# Patient Record
Sex: Female | Born: 1935 | Race: White | Hispanic: No
Health system: Southern US, Community
[De-identification: ages and names within clinical notes are randomized; demographics above are authoritative.]

## PROBLEM LIST (undated history)

## (undated) DIAGNOSIS — I10 Essential (primary) hypertension: Secondary | ICD-10-CM

---

## 2014-08-08 ENCOUNTER — Emergency Department (HOSPITAL_BASED_OUTPATIENT_CLINIC_OR_DEPARTMENT_OTHER)
Admission: EM | Admit: 2014-08-08 | Discharge: 2014-08-09 | Disposition: A | Discharge status: Home / Self Care | Payer: Medicare HMO | Attending: Emergency Medicine | Admitting: Emergency Medicine

## 2014-08-08 ENCOUNTER — Encounter (HOSPITAL_BASED_OUTPATIENT_CLINIC_OR_DEPARTMENT_OTHER): Payer: Self-pay | Admitting: *Deleted

## 2014-08-08 ENCOUNTER — Emergency Department (HOSPITAL_BASED_OUTPATIENT_CLINIC_OR_DEPARTMENT_OTHER): Payer: Medicare HMO

## 2014-08-08 DIAGNOSIS — R0602 Shortness of breath: Secondary | ICD-10-CM | POA: Diagnosis not present

## 2014-08-08 DIAGNOSIS — R1084 Generalized abdominal pain: Secondary | ICD-10-CM | POA: Diagnosis not present

## 2014-08-08 DIAGNOSIS — I10 Essential (primary) hypertension: Secondary | ICD-10-CM | POA: Insufficient documentation

## 2014-08-08 DIAGNOSIS — R112 Nausea with vomiting, unspecified: Secondary | ICD-10-CM | POA: Diagnosis present

## 2014-08-08 DIAGNOSIS — E876 Hypokalemia: Secondary | ICD-10-CM | POA: Diagnosis not present

## 2014-08-08 DIAGNOSIS — R1110 Vomiting, unspecified: Secondary | ICD-10-CM

## 2014-08-08 DIAGNOSIS — R531 Weakness: Secondary | ICD-10-CM | POA: Insufficient documentation

## 2014-08-08 HISTORY — DX: Essential (primary) hypertension: I10

## 2014-08-08 LAB — COMPREHENSIVE METABOLIC PANEL
ALK PHOS: 98 U/L (ref 39–117)
ALT: 13 U/L (ref 0–35)
AST: 22 U/L (ref 0–37)
Albumin: 4.7 g/dL (ref 3.5–5.2)
Anion gap: 11 (ref 5–15)
BUN: 11 mg/dL (ref 6–23)
CO2: 31 mmol/L (ref 19–32)
Calcium: 9.7 mg/dL (ref 8.4–10.5)
Chloride: 88 mmol/L — ABNORMAL LOW (ref 96–112)
Creatinine, Ser: 0.58 mg/dL (ref 0.50–1.10)
GFR calc Af Amer: >90 mL/min (ref >90)
GFR calc non Af Amer: 87 mL/min — ABNORMAL LOW (ref >90)
Glucose, Bld: 140 mg/dL — ABNORMAL HIGH (ref 70–99)
POTASSIUM: 2.9 mmol/L — AB (ref 3.5–5.1)
SODIUM: 130 mmol/L — AB (ref 135–145)
Total Bilirubin: 0.6 mg/dL (ref 0.3–1.2)
Total Protein: 7.6 g/dL (ref 6.0–8.3)

## 2014-08-08 LAB — LIPASE, BLOOD: LIPASE: 28 U/L (ref 11–59)

## 2014-08-08 LAB — CBC WITH DIFFERENTIAL/PLATELET
Basophils Absolute: 0 10*3/uL (ref 0.0–0.1)
Basophils Relative: 1 % (ref 0–1)
Eosinophils Absolute: 0 10*3/uL (ref 0.0–0.7)
Eosinophils Relative: 0 % (ref 0–5)
HCT: 45.1 % (ref 36.0–46.0)
Hemoglobin: 15.9 g/dL — ABNORMAL HIGH (ref 12.0–15.0)
Lymphocytes Relative: 11 % — ABNORMAL LOW (ref 12–46)
Lymphs Abs: 0.9 10*3/uL (ref 0.7–4.0)
MCH: 30.9 pg (ref 26.0–34.0)
MCHC: 35.3 g/dL (ref 30.0–36.0)
MCV: 87.7 fL (ref 78.0–100.0)
MONO ABS: 0.5 10*3/uL (ref 0.1–1.0)
Monocytes Relative: 6 % (ref 3–12)
Neutro Abs: 7 10*3/uL (ref 1.7–7.7)
Neutrophils Relative %: 82 % — ABNORMAL HIGH (ref 43–77)
PLATELETS: 278 10*3/uL (ref 150–400)
RBC: 5.14 MIL/uL — ABNORMAL HIGH (ref 3.87–5.11)
RDW: 12.6 % (ref 11.5–15.5)
WBC: 8.5 10*3/uL (ref 4.0–10.5)

## 2014-08-08 MED ORDER — IOHEXOL 300 MG/ML  SOLN: 80.0000 mL | Freq: Once | INTRAMUSCULAR | Status: AC | PRN

## 2014-08-08 MED ORDER — ONDANSETRON HCL 4 MG/2ML IJ SOLN
4.0000 mg | Freq: Once | INTRAMUSCULAR | Status: AC
  Administered 2014-08-08: 4 mg via INTRAVENOUS
  Filled 2014-08-08: qty 2

## 2014-08-08 MED ORDER — POTASSIUM CHLORIDE 10 MEQ/100ML IV SOLN
10.0000 meq | INTRAVENOUS | Status: AC
  Administered 2014-08-09: 10 meq via INTRAVENOUS
  Filled 2014-08-08: qty 100

## 2014-08-08 MED ORDER — SODIUM CHLORIDE 0.9 % IV BOLUS (SEPSIS)
1000.0000 mL | Freq: Once | INTRAVENOUS | Status: AC
  Administered 2014-08-09: 1000 mL via INTRAVENOUS

## 2014-08-08 MED ORDER — SODIUM CHLORIDE 0.9 % IV BOLUS (SEPSIS)
1000.0000 mL | Freq: Once | INTRAVENOUS | Status: AC
  Administered 2014-08-08: 1000 mL via INTRAVENOUS

## 2014-08-08 NOTE — ED Provider Notes (Addendum)
CSN: [id number]     Arrival date & time 08/08/14  2128 History  This chart was scribed for Gwyneth Sprout, MD by Gwenyth Ober, ED Scribe. This patient was seen in room MH03/MH03 and the patient's care was started at 10:16 PM.    Chief Complaint  Patient presents with  . Emesis   The history is provided by the patient. No language interpreter was used.    HPI Comments: Lorraine Griffin is a 79 y.o. female with a history of hysterectomy who presents to the Emergency Department complaining of intermittent, gradually improving vomiting that occurs hourly and started yesterday. She states RLQ abdominal pain that started this morning, 1 episode of hematemesis and nausea as associated symptoms. Pt notes increased weakness and shakiness that started 2-3 months ago. She also notes baseline SOB that is unchanged today. Pt drinks 1 beer weekly. She denies a history of abdominal surgeries. Pt also denies diarrhea, fever and cough as associated symptoms.   Past Medical History  Diagnosis Date  . Hypertension    No past surgical history on file. No family history on file. History  Substance Use Topics  . Smoking status: Not on file  . Smokeless tobacco: Not on file  . Alcohol Use: Not on file   OB History    No data available     Review of Systems  Constitutional: Negative for fever.  Respiratory: Positive for shortness of breath. Negative for cough.   Gastrointestinal: Positive for nausea, vomiting and abdominal pain. Negative for diarrhea.  Neurological: Positive for weakness.  All other systems reviewed and are negative.   Allergies  Review of patient's allergies indicates no known allergies.  Home Medications   Prior to Admission medications   Medication Sig Start Date End Date Taking? Authorizing Provider  AmLODIPine Besylate (NORVASC PO) Take by mouth.   Yes Historical Provider, MD  Telmisartan (MICARDIS PO) Take by mouth.   Yes Historical Provider, MD   BP 168/89 mmHg  Pulse  87  Temp(Src) 98.3 F (36.8 C) (Oral)  Resp 20  Ht  (1.651 m)  Wt 123 lb (55.792 kg)  BMI 20.47 kg/m2  SpO2 97% Physical Exam  Constitutional: She appears well-developed and well-nourished. No distress.  HENT:  Head: Normocephalic and atraumatic.  Dry MM  Eyes: Conjunctivae and EOM are normal.  Neck: Neck supple. No tracheal deviation present.  Cardiovascular: Normal rate, regular rhythm and normal heart sounds.   Pulmonary/Chest: Effort normal and breath sounds normal. No respiratory distress.  Slight tachypnea; lungs are clear  Abdominal:  No localized abdominal tenderness; normal bowel sounds  Musculoskeletal: She exhibits no edema.  Skin: Skin is warm and dry. No rash noted.  Psychiatric: She has a normal mood and affect. Her behavior is normal.  Nursing note and vitals reviewed.   ED Course  Procedures   DIAGNOSTIC STUDIES: Oxygen Saturation is 97% on RA, normal by my interpretation.    COORDINATION OF CARE: 10:22 PM Discussed treatment plan with pt and her daughter which includes lab work. They agreed to plan.   Labs Review Labs Reviewed  CBC WITH DIFFERENTIAL/PLATELET - Abnormal; Notable for the following:    RBC 5.14 (*)    Hemoglobin 15.9 (*)    Neutrophils Relative % 82 (*)    Lymphocytes Relative 11 (*)    All other components within normal limits  COMPREHENSIVE METABOLIC PANEL - Abnormal; Notable for the following:    Sodium 130 (*)    Potassium 2.9 (*)  Chloride 88 (*)    Glucose, Bld 140 (*)    GFR calc non Af Amer 87 (*)    All other components within normal limits  LIPASE, BLOOD  URINALYSIS, ROUTINE W REFLEX MICROSCOPIC    Imaging Review Ct Chest W Contrast  08/09/2014   CLINICAL DATA:  Acute onset of generalized abdominal pain and vomiting. Initial encounter.  EXAM: CT CHEST WITH CONTRAST  TECHNIQUE: Multidetector CT imaging of the chest was performed during intravenous contrast administration.  CONTRAST:  80mL OMNIPAQUE IOHEXOL 300  MG/ML  SOLN  COMPARISON:  Chest radiograph performed earlier today at 10:50 p.m.  FINDINGS: Mild scarring is noted at the right lung apex. The apparent density overlying the right lung apex noted on chest radiograph appears to have been artifactual in nature. Minimal left basilar atelectasis is noted, with adjacent scarring. The lungs are otherwise clear. No pleural effusion or pneumothorax is seen. No suspicious masses are identified.  Scattered coronary artery calcifications are seen. There is slight prominence of the ascending thoracic aorta, without evidence of aneurysmal dilatation. Scattered calcification is noted along the thoracic aorta, with minimal associated mural thrombus. No pericardial effusion is identified. No mediastinal lymphadenopathy is seen. The great vessels are grossly unremarkable in appearance.  The thyroid gland is grossly unremarkable in appearance. No axillary lymphadenopathy is seen.  The visualized portions of the liver and spleen are grossly unremarkable. The visualized portions of the adrenal glands, kidneys and pancreas are grossly unremarkable.  No acute osseous abnormalities are identified.  IMPRESSION: 1. Mild scarring at the right lung apex. The apparent density overlying the right lung apex on chest radiograph appears to have been artifactual in nature. 2. Minimal left basilar atelectasis, with adjacent scarring. Lungs otherwise clear. 3. Scattered coronary artery calcifications seen.   Electronically Signed   By: Roanna RaiderJeffery  Chang M.D.   On: 08/09/2014 00:27   Dg Abd Acute W/chest  08/08/2014   CLINICAL DATA:  Acute onset of generalized abdominal pain and vomiting. Initial encounter.  EXAM: ACUTE ABDOMEN SERIES (ABDOMEN 2 VIEW & CHEST 1 VIEW)  COMPARISON:  None.  FINDINGS: The lungs are hyperexpanded, with flattening of the hemidiaphragms, compatible with COPD. Vague right upper lung zone airspace opacity is suggested, though this may simply reflect overlying osseous structures  and skin folds. Would correlate for any evidence of pneumonia. If the patient does not have symptoms of pneumonia, CT of the chest would be helpful for further evaluation.  There is no evidence of effusion or pneumothorax. The cardiomediastinal silhouette is within normal limits.  The visualized bowel gas pattern is unremarkable. Scattered stool and air are seen within the colon; there is no evidence of small bowel dilatation to suggest obstruction. No free intra-abdominal air is identified on the provided upright view.  No acute osseous abnormalities are seen; mild degenerative change is noted along the lumbar spine and at the sacroiliac joints. Diffuse vascular calcifications are seen.  IMPRESSION: 1. Vague right upper lung zone airspace opacity suggested, though this may simply reflect overlying osseous structures and skin folds. Would correlate for any evidence of pneumonia. If the patient does not have symptoms of pneumonia, CT of the chest would be helpful for further evaluation. 2. Findings of COPD. 3. Unremarkable bowel gas pattern; no free intra-abdominal air seen. Small amount of stool noted in the colon.   Electronically Signed   By: Roanna RaiderJeffery  Chang M.D.   On: 08/08/2014 22:58     EKG Interpretation   Date/Time:  Tuesday August 08 2014 23:28:48 EDT Ventricular Rate:  75 PR Interval:  170 QRS Duration: 100 QT Interval:  422 QTC Calculation: 471 R Axis:   73 Text Interpretation:  Normal sinus rhythm Incomplete right bundle branch  block Cannot rule out Anterior infarct , age undetermined No previous  tracing Confirmed by Anitra Lauth  MD, Elainna Eshleman ([id number]) on 08/08/2014 11:31:20  PM      MDM   Final diagnoses:  Vomiting  Generalized abdominal pain  Hypokalemia    Patient with abrupt onset of vomiting starting yesterday every hour and continuing today. After numerous episodes of vomiting she has had blood-streaked in her vomitus which sounds more like Mallory-Weiss tears. Also today  patient developed a right-sided abdominal tenderness that cannot be reproduced on exam. She denies any urinary symptoms or respiratory symptoms. She has COPD and states that she is always short of breath does not feel like it's any different than her baseline. Patient denies any abdominal surgeries other than a hysterectomy.  Patient's vital signs are within normal limits other than mild hypertension. She has dry mucous membranes and appears dehydrated.  Concern for viral etiology versus obstruction versus cholecystitis versus appendicitis versus pancreatitis. Low suspicion for urinary cause of her symptoms that she has no urinary complaints. Also lower suspicion for respiratory causes patient denies any coughing her change in her respiratory status.  Patient given IV fluids and Zofran. CBC, CMP, lipase, acute abdominal series pending.  11:23 PM CBC without acute findings, CMP with hypokalemia and hyponatremia but normal renal function. Lipase within normal limits. Acute abdominal series showed no acute abdominal findings however did show a vague right upper lung zone airspace opacity which they recommended CT.  11:29 PM On repeat evaluation after IV fluid and Zofran patient has no abdominal pain now. However given her extensive smoking history in this area and her lung will do a CT of the chest for further evaluation.  12:34 AM CT is negative for any acute findings. Will discharge patient home with H2 blocker and she has no appointment with her primary on April 7. At that time she'll most likely get referral to GI given her months of stomach problems. She is not a drinker and has no reason to have liver disease or esophageal varices. Feel the blood that she vomited tonight or from Mallory-Weiss tears.  I personally performed the services described in this documentation, which was scribed in my presence.  The recorded information has been reviewed and considered.    Gwyneth Sprout, MD 08/08/14  1308  Gwyneth Sprout, MD 08/09/14 [id number]  Gwyneth Sprout, MD 08/09/14 [id number]

## 2014-08-08 NOTE — ED Notes (Signed)
Vomiting since yesterday am. Abdominal pain.

## 2014-08-09 MED ORDER — IOHEXOL 300 MG/ML  SOLN
80.0000 mL | Freq: Once | INTRAMUSCULAR | Status: AC | PRN
  Administered 2014-08-09: 80 mL via INTRAVENOUS

## 2014-08-09 MED ORDER — RANITIDINE HCL 150 MG PO TABS: 150.0000 mg | ORAL_TABLET | Freq: Two times a day (BID) | ORAL | Status: AC

## 2014-08-09 MED ORDER — SUCRALFATE 1 G PO TABS: 1.0000 g | ORAL_TABLET | Freq: Three times a day (TID) | ORAL | Status: AC

## 2014-08-09 NOTE — Discharge Instructions (Signed)
Abdominal Pain, Women °Abdominal (stomach, pelvic, or belly) pain can be caused by many things. It is important to tell your doctor: °· The location of the pain. °· Does it come and go or is it present all the time? °· Are there things that start the pain (eating certain foods, exercise)? °· Are there other symptoms associated with the pain (fever, nausea, vomiting, diarrhea)? °All of this is helpful to know when trying to find the cause of the pain. °CAUSES  °· Stomach: virus or bacteria infection, or ulcer. °· Intestine: appendicitis (inflamed appendix), regional ileitis (Crohn's disease), ulcerative colitis (inflamed colon), irritable bowel syndrome, diverticulitis (inflamed diverticulum of the colon), or cancer of the stomach or intestine. °· Gallbladder disease or stones in the gallbladder. °· Kidney disease, kidney stones, or infection. °· Pancreas infection or cancer. °· Fibromyalgia (pain disorder). °· Diseases of the female organs: °¨ Uterus: fibroid (non-cancerous) tumors or infection. °¨ Fallopian tubes: infection or tubal pregnancy. °¨ Ovary: cysts or tumors. °¨ Pelvic adhesions (scar tissue). °¨ Endometriosis (uterus lining tissue growing in the pelvis and on the pelvic organs). °¨ Pelvic congestion syndrome (female organs filling up with blood just before the menstrual period). °¨ Pain with the menstrual period. °¨ Pain with ovulation (producing an egg). °¨ Pain with an IUD (intrauterine device, birth control) in the uterus. °¨ Cancer of the female organs. °· Functional pain (pain not caused by a disease, may improve without treatment). °· Psychological pain. °· Depression. °DIAGNOSIS  °Your doctor will decide the seriousness of your pain by doing an examination. °· Blood tests. °· X-rays. °· Ultrasound. °· CT scan (computed tomography, special type of X-ray). °· MRI (magnetic resonance imaging). °· Cultures, for infection. °· Barium enema (dye inserted in the large intestine, to better view it with  X-rays). °· Colonoscopy (looking in intestine with a lighted tube). °· Laparoscopy (minor surgery, looking in abdomen with a lighted tube). °· Major abdominal exploratory surgery (looking in abdomen with a large incision). °TREATMENT  °The treatment will depend on the cause of the pain.  °· Many cases can be observed and treated at home. °· Over-the-counter medicines recommended by your caregiver. °· Prescription medicine. °· Antibiotics, for infection. °· Birth control pills, for painful periods or for ovulation pain. °· Hormone treatment, for endometriosis. °· Nerve blocking injections. °· Physical therapy. °· Antidepressants. °· Counseling with a psychologist or psychiatrist. °· Minor or major surgery. °HOME CARE INSTRUCTIONS  °· Do not take laxatives, unless directed by your caregiver. °· Take over-the-counter pain medicine only if ordered by your caregiver. Do not take aspirin because it can cause an upset stomach or bleeding. °· Try a clear liquid diet (broth or water) as ordered by your caregiver. Slowly move to a bland diet, as tolerated, if the pain is related to the stomach or intestine. °· Have a thermometer and take your temperature several times a day, and record it. °· Bed rest and sleep, if it helps the pain. °· Avoid sexual intercourse, if it causes pain. °· Avoid stressful situations. °· Keep your follow-up appointments and tests, as your caregiver orders. °· If the pain does not go away with medicine or surgery, you may try: °¨ Acupuncture. °¨ Relaxation exercises (yoga, meditation). °¨ Group therapy. °¨ Counseling. °SEEK MEDICAL CARE IF:  °· You notice certain foods cause stomach pain. °· Your home care treatment is not helping your pain. °· You need stronger pain medicine. °· You want your IUD removed. °· You feel faint or   lightheaded. °· You develop nausea and vomiting. °· You develop a rash. °· You are having side effects or an allergy to your medicine. °SEEK IMMEDIATE MEDICAL CARE IF:  °· Your  pain does not go away or gets worse. °· You have a fever. °· Your pain is felt only in portions of the abdomen. The right side could possibly be appendicitis. The left lower portion of the abdomen could be colitis or diverticulitis. °· You are passing blood in your stools (bright red or black tarry stools, with or without vomiting). °· You have blood in your urine. °· You develop chills, with or without a fever. °· You pass out. °MAKE SURE YOU:  °· Understand these instructions. °· Will watch your condition. °· Will get help right away if you are not doing well or get worse. °Document Released: 02/23/2007 Document Revised: 09/12/2013 Document Reviewed: 03/15/2009 °[employer]® Patient Information ©2015 [employer]. This information is not intended to replace advice given to you by your health care provider. Make sure you discuss any questions you have with your health care provider. ° °

## 2016-01-04 IMAGING — CR DG ABDOMEN ACUTE W/ 1V CHEST
3 series · 3 of 3 positions shown · non-contrast
Comparison: None.

CLINICAL DATA: Acute onset of generalized abdominal pain and
vomiting. Initial encounter.

EXAM:
ACUTE ABDOMEN SERIES (ABDOMEN 2 VIEW & CHEST 1 VIEW)

[w chest pa]
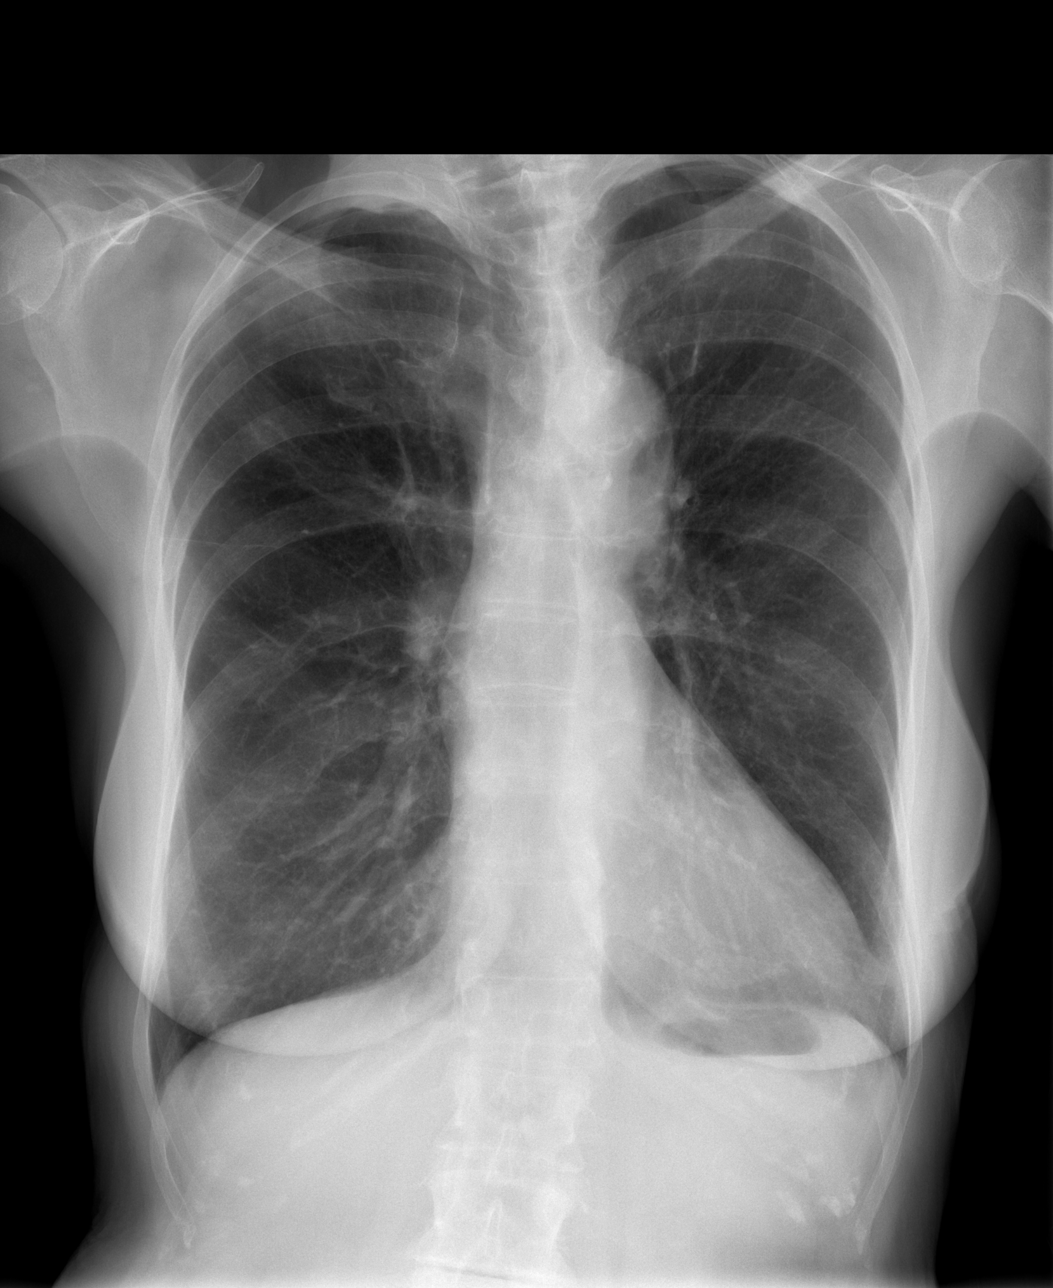

[w abdomen upright]
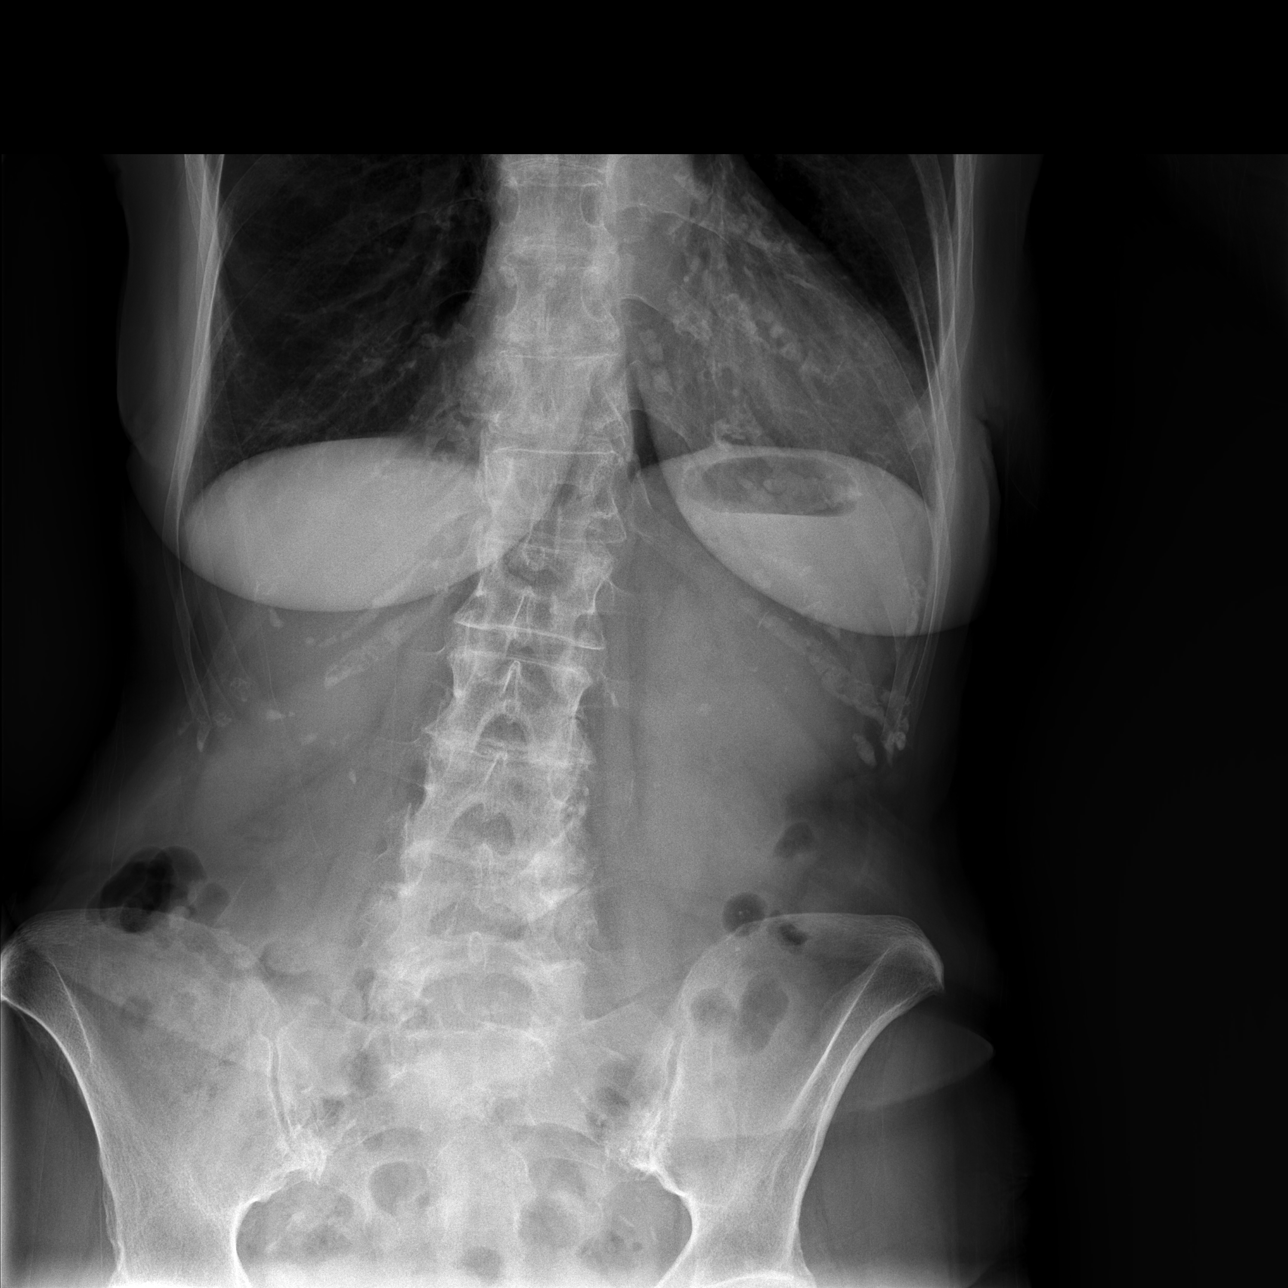

[t abdomen supine]
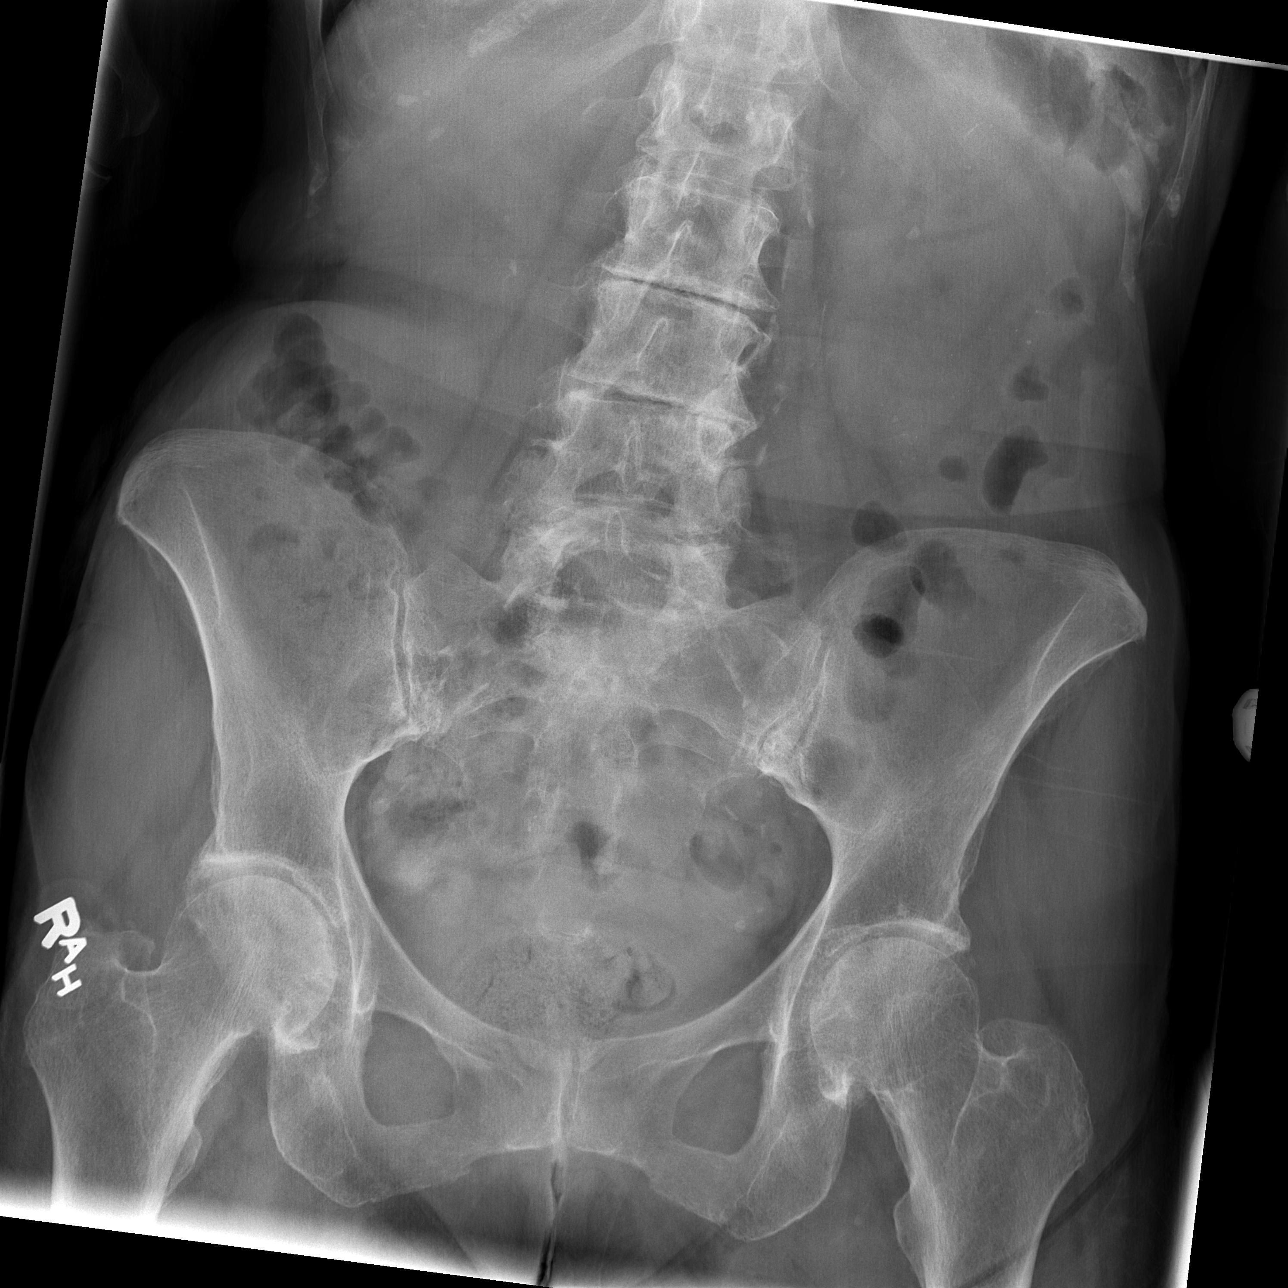

[3 of 3 positions shown; findings below may reference images not displayed]

FINDINGS: The lungs are hyperexpanded, with flattening of the hemidiaphragms,
compatible with COPD. Vague right upper lung zone airspace opacity
is suggested, though this may simply reflect overlying osseous
structures and skin folds. Would correlate for any evidence of
pneumonia. If the patient does not have symptoms of pneumonia, CT of
the chest would be helpful for further evaluation.

There is no evidence of effusion or pneumothorax. The
cardiomediastinal silhouette is within normal limits.

The visualized bowel gas pattern is unremarkable. Scattered stool
and air are seen within the colon; there is no evidence of small
bowel dilatation to suggest obstruction. No free intra-abdominal air
is identified on the provided upright view.

No acute osseous abnormalities are seen; mild degenerative change is
noted along the lumbar spine and at the sacroiliac joints. Diffuse
vascular calcifications are seen.
IMPRESSION: 1. Vague right upper lung zone airspace opacity suggested, though
this may simply reflect overlying osseous structures and skin folds.
Would correlate for any evidence of pneumonia. If the patient does
not have symptoms of pneumonia, CT of the chest would be helpful for
further evaluation.
2. Findings of COPD.
3. Unremarkable bowel gas pattern; no free intra-abdominal air seen.
Small amount of stool noted in the colon.

## 2016-01-04 IMAGING — CT CT CHEST W/ CM
2 of 3 series · 15 of 36 positions shown, 18 images · IV contrast (APPLIED)
Comparison: Chest radiograph performed earlier today at [DATE] p.m.

CLINICAL DATA: Acute onset of generalized abdominal pain and
vomiting. Initial encounter.

EXAM:
CT CHEST WITH CONTRAST
TECHNIQUE: Multidetector CT imaging of the chest was performed during
intravenous contrast administration.
CONTRAST:  80mL OMNIPAQUE IOHEXOL 300 MG/ML  SOLN

[Series 2: chest 5.0 b31f · axial · 0.57mm/px · z∈[+526,+800]mm · 12 of 65 slices shown, 15 images]
[im 5/65  mediastinal]
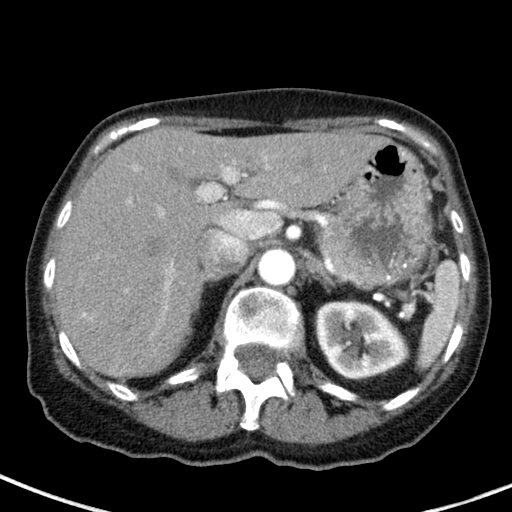
[im 5/65  lung]
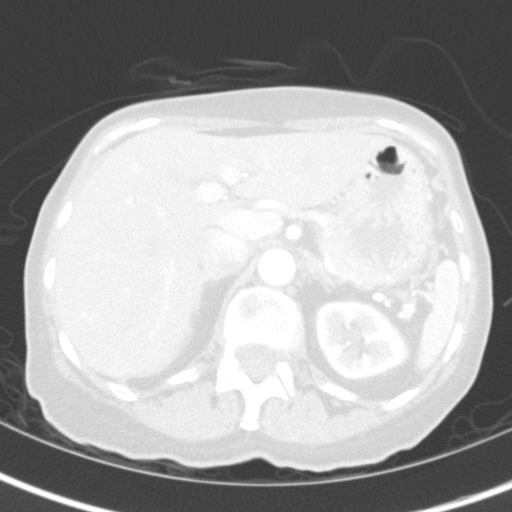
[im 10/65  lung]
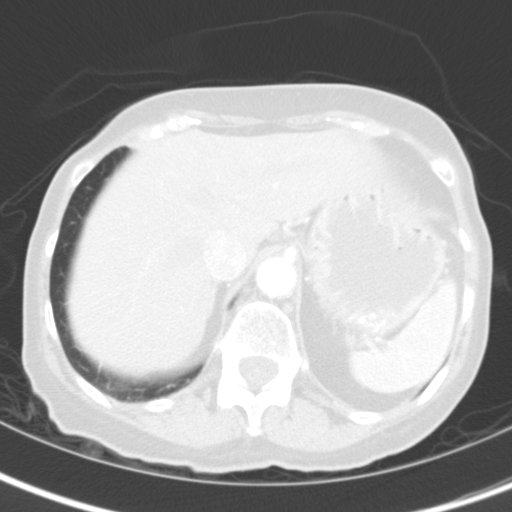
[im 15/65  lung]
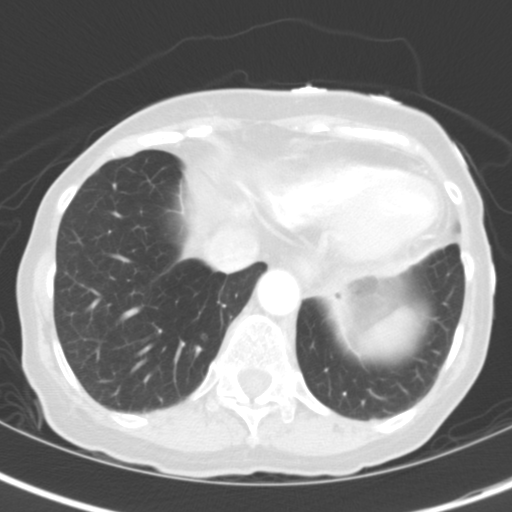
[im 19/65  lung]
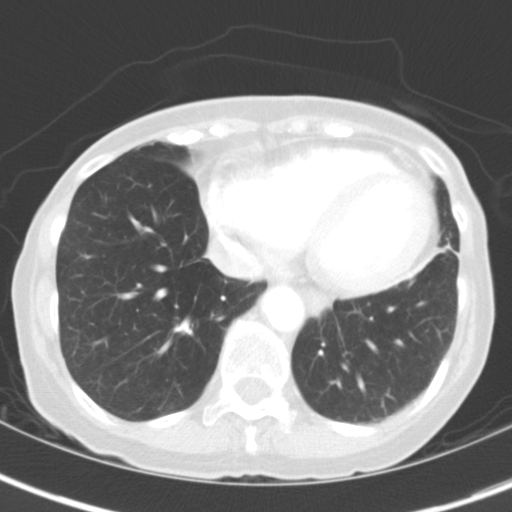
[im 24/65  mediastinal]
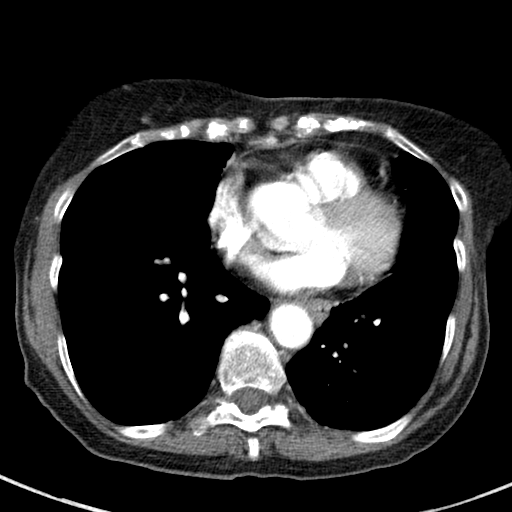
[im 24/65  lung]
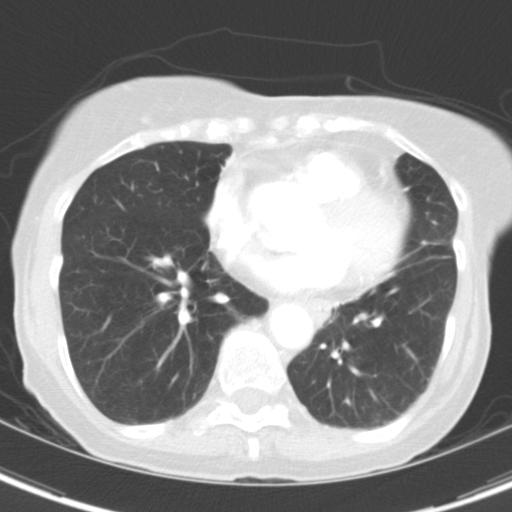
[im 29/65  lung]
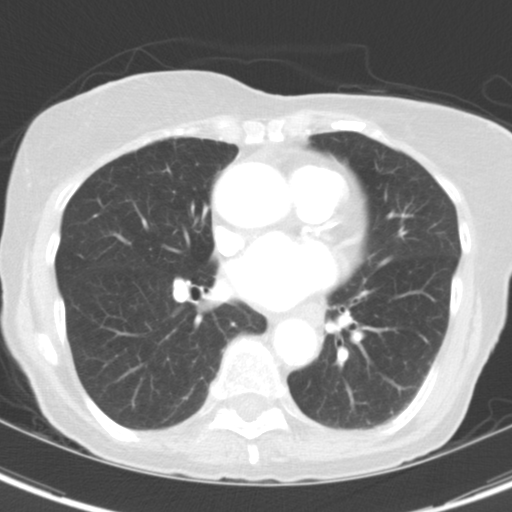
[im 36/65  lung]
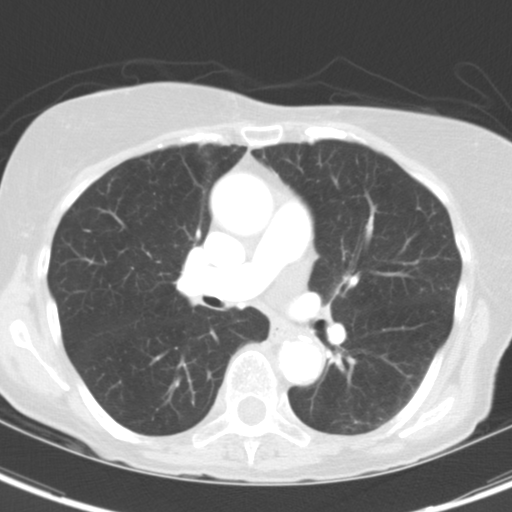
[im 41/65  lung]
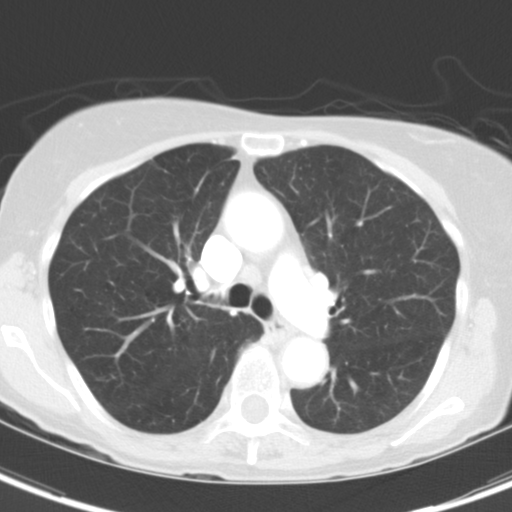
[im 46/65  mediastinal]
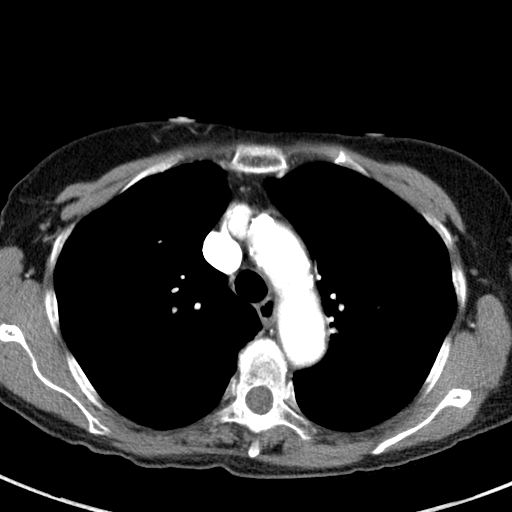
[im 46/65  lung]
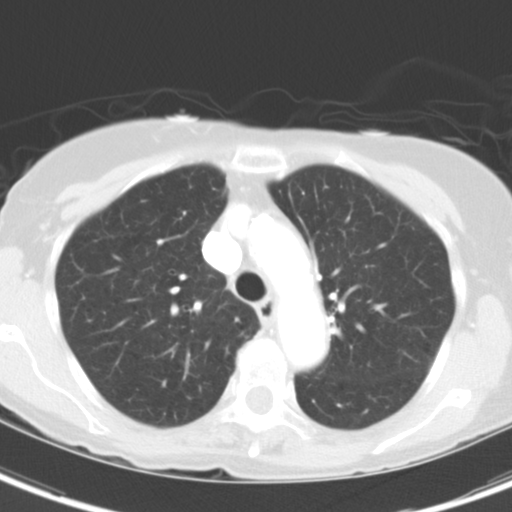
[im 50/65  lung]
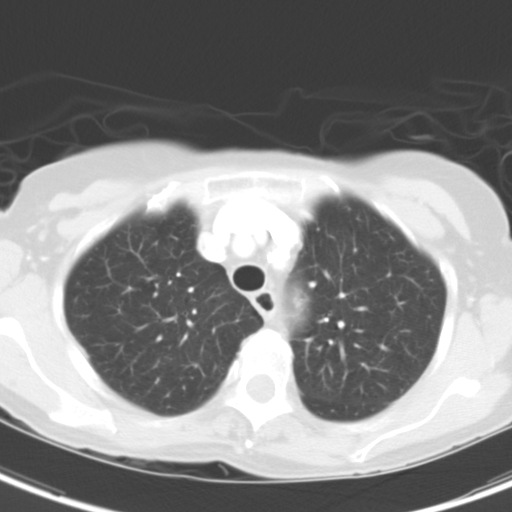
[im 55/65  lung]
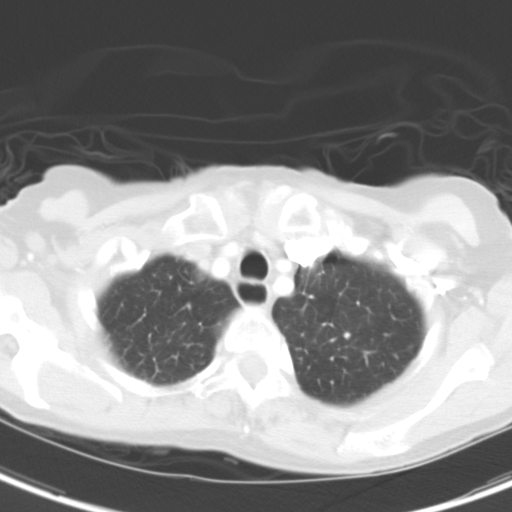
[im 60/65  lung]
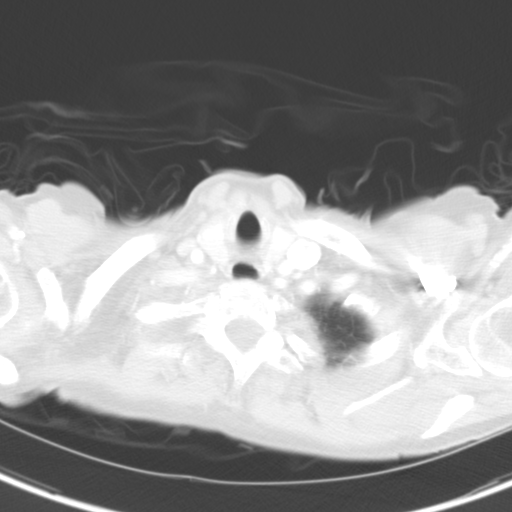

[Series 6: chest 3.0 coronal · coronal · 0.59mm/px · 3 of 74 slices shown]
[im 15/74  lung]
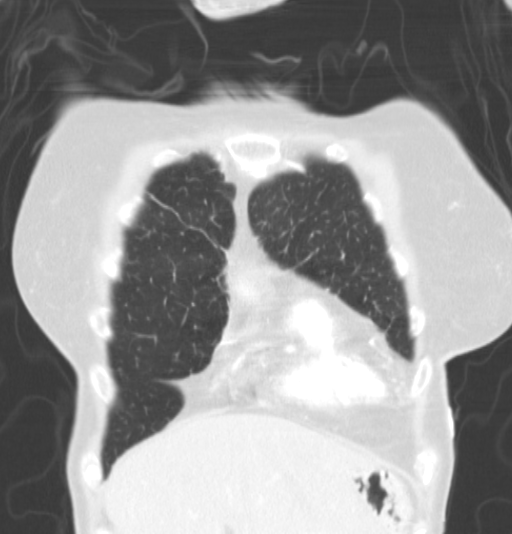
[im 30/74  lung]
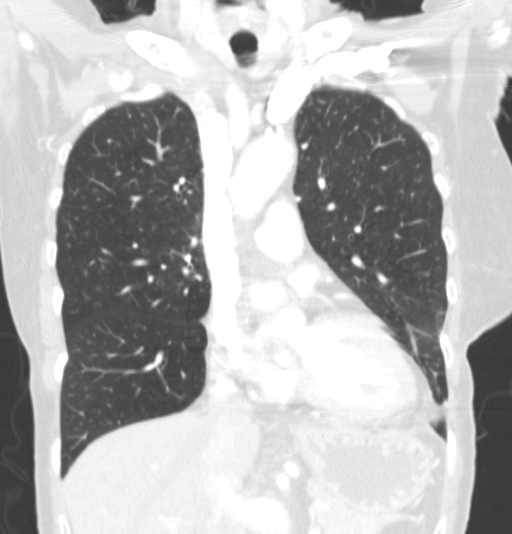
[im 44/74  lung]
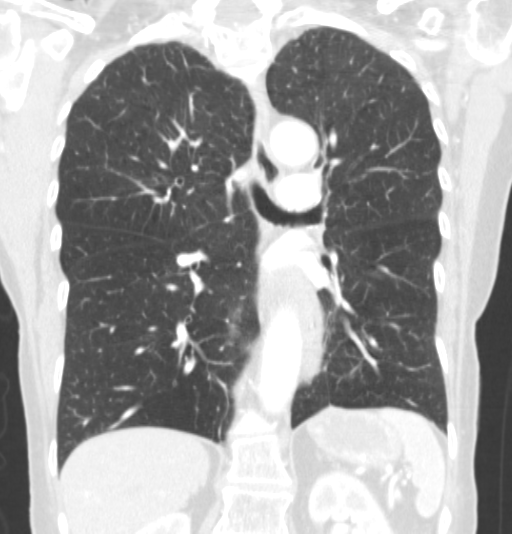

[15 of 36 positions shown; findings below may reference images not displayed]

FINDINGS: Mild scarring is noted at the right lung apex. The apparent density
overlying the right lung apex noted on chest radiograph appears to
have been artifactual in nature. Minimal left basilar atelectasis is
noted, with adjacent scarring. The lungs are otherwise clear. No
pleural effusion or pneumothorax is seen. No suspicious masses are
identified.

Scattered coronary artery calcifications are seen. There is slight
prominence of the ascending thoracic aorta, without evidence of
aneurysmal dilatation. Scattered calcification is noted along the
thoracic aorta, with minimal associated mural thrombus. No
pericardial effusion is identified. No mediastinal lymphadenopathy
is seen. The great vessels are grossly unremarkable in appearance.

The thyroid gland is grossly unremarkable in appearance. No axillary
lymphadenopathy is seen.

The visualized portions of the liver and spleen are grossly
unremarkable. The visualized portions of the adrenal glands, kidneys
and pancreas are grossly unremarkable.

No acute osseous abnormalities are identified.
IMPRESSION: 1. Mild scarring at the right lung apex. The apparent density
overlying the right lung apex on chest radiograph appears to have
been artifactual in nature.
2. Minimal left basilar atelectasis, with adjacent scarring. Lungs
otherwise clear.
3. Scattered coronary artery calcifications seen.

## 2018-03-12 DEATH — deceased
